# Patient Record
Sex: Male | Born: 1995 | Hispanic: No | Marital: Single | Smoking: Never smoker
Health system: Southern US, Community
[De-identification: ages and names within clinical notes are randomized; demographics above are authoritative.]

## PROBLEM LIST (undated history)

## (undated) ENCOUNTER — Emergency Department (HOSPITAL_BASED_OUTPATIENT_CLINIC_OR_DEPARTMENT_OTHER): Discharge status: Against Medical Advice | Payer: Commercial Managed Care - PPO

---
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
## 2014-07-24 ENCOUNTER — Emergency Department (HOSPITAL_BASED_OUTPATIENT_CLINIC_OR_DEPARTMENT_OTHER): Payer: 59

## 2014-07-24 ENCOUNTER — Emergency Department (HOSPITAL_BASED_OUTPATIENT_CLINIC_OR_DEPARTMENT_OTHER)
Admission: EM | Admit: 2014-07-24 | Discharge: 2014-07-24 | Disposition: A | Discharge status: Home / Self Care | Payer: 59 | Attending: Emergency Medicine | Admitting: Emergency Medicine

## 2014-07-24 ENCOUNTER — Encounter (HOSPITAL_BASED_OUTPATIENT_CLINIC_OR_DEPARTMENT_OTHER): Payer: Self-pay | Admitting: Emergency Medicine

## 2014-07-24 DIAGNOSIS — S99929A Unspecified injury of unspecified foot, initial encounter: Secondary | ICD-10-CM | POA: Diagnosis present

## 2014-07-24 DIAGNOSIS — S8000XA Contusion of unspecified knee, initial encounter: Secondary | ICD-10-CM | POA: Diagnosis not present

## 2014-07-24 DIAGNOSIS — S8001XA Contusion of right knee, initial encounter: Secondary | ICD-10-CM

## 2014-07-24 DIAGNOSIS — S8990XA Unspecified injury of unspecified lower leg, initial encounter: Secondary | ICD-10-CM | POA: Insufficient documentation

## 2014-07-24 DIAGNOSIS — Y9389 Activity, other specified: Secondary | ICD-10-CM | POA: Diagnosis not present

## 2014-07-24 DIAGNOSIS — S99919A Unspecified injury of unspecified ankle, initial encounter: Secondary | ICD-10-CM

## 2014-07-24 DIAGNOSIS — Y92410 Unspecified street and highway as the place of occurrence of the external cause: Secondary | ICD-10-CM | POA: Insufficient documentation
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
## 2014-07-24 NOTE — ED Provider Notes (Signed)
Medical screening examination/treatment/procedure(s) were performed by non-physician practitioner and as supervising physician I was immediately available for consultation/collaboration.   EKG Interpretation None        Rolan Bucco, MD 07/24/14 1514
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
## 2014-07-24 NOTE — Discharge Instructions (Signed)
Contusion A contusion is a deep bruise. Contusions are the result of an injury that caused bleeding under the skin. The contusion may turn blue, purple, or yellow. Minor injuries will give you a painless contusion, but more severe contusions may stay painful and swollen for a few weeks.  CAUSES  A contusion is usually caused by a blow, trauma, or direct force to an area of the body. SYMPTOMS   Swelling and redness of the injured area.  Bruising of the injured area.  Tenderness and soreness of the injured area.  Pain. DIAGNOSIS  The diagnosis can be made by taking a history and physical exam. An X-ray, CT scan, or MRI may be needed to determine if there were any associated injuries, such as fractures. TREATMENT  Specific treatment will depend on what area of the body was injured. In general, the best treatment for a contusion is resting, icing, elevating, and applying cold compresses to the injured area. Over-the-counter medicines may also be recommended for pain control. Ask your caregiver what the best treatment is for your contusion. HOME CARE INSTRUCTIONS   Put ice on the injured area.  Put ice in a plastic bag.  Place a towel between your skin and the bag.  Leave the ice on for 15-20 minutes, 3-4 times a day, or as directed by your health care provider.  Only take over-the-counter or prescription medicines for pain, discomfort, or fever as directed by your caregiver. Your caregiver may recommend avoiding anti-inflammatory medicines (aspirin, ibuprofen, and naproxen) for 48 hours because these medicines may increase bruising.  Rest the injured area.  If possible, elevate the injured area to reduce swelling. SEEK IMMEDIATE MEDICAL CARE IF:   You have increased bruising or swelling.  You have pain that is getting worse.  Your swelling or pain is not relieved with medicines. MAKE SURE YOU:   Understand these instructions.  Will watch your condition.  Will get help right  away if you are not doing well or get worse. Document Released: 08/24/2005 Document Revised: 11/19/2013 Document Reviewed: 09/19/2011 [facility] Patient Information 2015 [employer] [place]. This information is not intended to replace advice given to you by your health care provider. Make sure you discuss any questions you have with your health care provider.  Contusion A contusion is a deep bruise. Contusions are the result of an injury that caused bleeding under the skin. The contusion may turn blue, purple, or yellow. Minor injuries will give you a painless contusion, but more severe contusions may stay painful and swollen for a few weeks.  CAUSES  A contusion is usually caused by a blow, trauma, or direct force to an area of the body. SYMPTOMS   Swelling and redness of the injured area.  Bruising of the injured area.  Tenderness and soreness of the injured area.  Pain. DIAGNOSIS  The diagnosis can be made by taking a history and physical exam. An X-ray, CT scan, or MRI may be needed to determine if there were any associated injuries, such as fractures. TREATMENT  Specific treatment will depend on what area of the body was injured. In general, the best treatment for a contusion is resting, icing, elevating, and applying cold compresses to the injured area. Over-the-counter medicines may also be recommended for pain control. Ask your caregiver what the best treatment is for your contusion. HOME CARE INSTRUCTIONS   Put ice on the injured area.  Put ice in a plastic bag.  Place a towel between your skin and the  bag. °¨ Leave the ice on for 15-20 minutes, 3-4 times a day, or as directed by your health care provider. °· Only take over-the-counter or prescription medicines for pain, discomfort, or fever as directed by your caregiver. Your caregiver may recommend avoiding anti-inflammatory medicines (aspirin, ibuprofen, and naproxen) for 48 hours because these medicines may increase  bruising. °· Rest the injured area. °· If possible, elevate the injured area to reduce swelling. °SEEK IMMEDIATE MEDICAL CARE IF:  °· You have increased bruising or swelling. °· You have pain that is getting worse. °· Your swelling or pain is not relieved with medicines. °MAKE SURE YOU:  °· Understand these instructions. °· Will watch your condition. °· Will get help right away if you are not doing well or get worse. °Document Released: 08/24/2005 Document Revised: 11/19/2013 Document Reviewed: 09/19/2011 °Exit[employer]® Patient Information ©2015 [employer]. This information is not intended to replace advice given to you by your health care provider. Make sure you discuss any questions you have with your health care provider. ° °

## 2014-08-01 ENCOUNTER — Other Ambulatory Visit: Payer: Self-pay | Admitting: Family Medicine

## 2014-08-01 DIAGNOSIS — R609 Edema, unspecified: Secondary | ICD-10-CM

## 2014-08-06 ENCOUNTER — Ambulatory Visit
Admission: RE | Admit: 2014-08-06 | Discharge: 2014-08-06 | Disposition: A | Discharge status: Home / Self Care | Payer: 59 | Source: Ambulatory Visit | Attending: Family Medicine | Admitting: Family Medicine

## 2014-08-06 DIAGNOSIS — R609 Edema, unspecified: Secondary | ICD-10-CM

## 2014-10-13 ENCOUNTER — Other Ambulatory Visit (INDEPENDENT_AMBULATORY_CARE_PROVIDER_SITE_OTHER): Payer: Self-pay | Admitting: General Surgery

## 2014-10-29 NOTE — Patient Instructions (Signed)
[address]  10/29/2014   [facility]: [facility]  [facility]  [facility] and follow signs to               Short Stay Center at 720 AM.  Call this number if you have problems the morning of surgery [phone]   Remember:  Do not eat food or drink liquids :After Midnight.     Take these medicines the morning of surgery with A SIP OF WATER:                                You may not have any metal on your body including hair pins and              piercings  Do not wear jewelry, make-up, lotions, powders or perfumes.             Do not wear nail polish.  Do not shave  48 hours prior to surgery.              Men may shave face and neck.   Do not bring valuables to the hospital. [name] IS NOT             RESPONSIBLE   FOR VALUABLES.  Contacts, dentures or bridgework may not be worn into surgery.  Leave suitcase in the car. After surgery it may be brought to your room.     Patients discharged the day of surgery will not be allowed to drive home.  Name and phone number of your driver:  Special Instructions: N/A              Please read over the following fact sheets you were given: _____________________________________________________________________             [name] Pre[facility] Before surgery, you can play an important role.  Because skin is not sterile, your skin needs to be as free of germs as possible.  You can reduce the number of germs on your skin by washing with CHG (chlorahexidine gluconate) soap before surgery.  CHG is an antiseptic cleaner which kills germs and bonds with the skin to continue killing germs even after washing. Please DO NOT use if you have an allergy to CHG or antibacterial soaps.  If your skin becomes reddened/irritated stop using the CHG and inform your nurse when you arrive at Short Stay. Do not shave (including legs and underarms) for at  least 48 hours prior to the first CHG shower.  You may shave your face/neck. Please follow these instructions carefully:  1.  Shower with CHG Soap the night before surgery and the  morning of Surgery.  2.  If you choose to wash your hair, wash your hair first as usual with your  normal  shampoo.  3.  After you shampoo, rinse your hair and body thoroughly to remove the  shampoo.                           4.  Use CHG as you would any other liquid soap.  You can apply chg directly  to the skin and wash  Gently with a scrungie or clean washcloth.  5.  Apply the CHG Soap to your body ONLY FROM THE NECK DOWN.   Do not use on face/ open                           Wound or open sores. Avoid contact with eyes, ears mouth and genitals (private parts).                       Wash face,  Genitals (private parts) with your normal soap.             6.  Wash thoroughly, paying special attention to the area where your surgery  will be performed.  7.  Thoroughly rinse your body with warm water from the neck down.  8.  DO NOT shower/wash with your normal soap after using and rinsing off  the CHG Soap.                9.  Pat yourself dry with a clean towel.            10.  Wear clean pajamas.            11.  Place clean sheets on your bed the night of your first shower and do not  sleep with pets. Day of Surgery : Do not apply any lotions/deodorants the morning of surgery.  Please wear clean clothes to the hospital/surgery center.  FAILURE TO FOLLOW THESE INSTRUCTIONS MAY RESULT IN THE CANCELLATION OF YOUR SURGERY PATIENT SIGNATURE_________________________________  NURSE SIGNATURE__________________________________  ________________________________________________________________________

## 2014-10-30 ENCOUNTER — Inpatient Hospital Stay (HOSPITAL_COMMUNITY)
Admission: RE | Admit: 2014-10-30 | Discharge: 2014-10-30 | Disposition: A | Discharge status: Home / Self Care | Payer: 59 | Source: Ambulatory Visit

## 2014-10-30 NOTE — Patient Instructions (Signed)
[place]  10/30/2014   Your procedure is scheduled on:  11/03/2014    Come thru the Cancer Center Entrance.   Follow the Signs to Short Stay Center at   0720     am  Call this number if you have problems the morning of surgery: [phone]   Remember:   Do not eat food or drink liquids after midnight.   Take these medicines the morning of surgery with A SIP OF WATER:    Do not wear jewelry,   Do not wear lotions, powders, or perfumes. deodorant.  . Men may shave face and neck.  Do not bring valuables to the hospital.  Contacts, dentures or bridgework may not be worn into surgery.       Patients discharged the day of surgery will not be allowed to drive  home.  Name and phone number of your driver:   SEE CHG INSTRUCTION SHEET    Please read over the following fact sheets that you were given:  coughing and deep breathing exercises, leg exercises            [place] - Preparing for Surgery Before surgery, you can play an important role.  Because skin is not sterile, your skin needs to be as free of germs as possible.  You can reduce the number of germs on your skin by washing with CHG (chlorahexidine gluconate) soap before surgery.  CHG is an antiseptic cleaner which kills germs and bonds with the skin to continue killing germs even after washing. Please DO NOT use if you have an allergy to CHG or antibacterial soaps.  If your skin becomes reddened/irritated stop using the CHG and inform your nurse when you arrive at Short Stay. Do not shave (including legs and underarms) for at least 48 hours prior to the first CHG shower.  You may shave your face/neck. Please follow these instructions carefully:  1.  Shower with CHG Soap the night before surgery and the  morning of Surgery.  2.  If you choose to wash your hair, wash your hair first as usual with your  normal  shampoo.  3.  After you shampoo, rinse your hair and body thoroughly to remove the  shampoo.                            4.  Use CHG as you would any other liquid soap.  You can apply chg directly  to the skin and wash                       Gently with a scrungie or clean washcloth.  5.  Apply the CHG Soap to your body ONLY FROM THE NECK DOWN.   Do not use on face/ open                           Wound or open sores. Avoid contact with eyes, ears mouth and genitals (private parts).                       Wash face,  Genitals (private parts) with your normal soap.             6.  Wash thoroughly, paying special attention to the area where your surgery  will be performed.  7.  Thoroughly rinse your body with warm water from the  neck down.  8.  DO NOT shower/wash with your normal soap after using and rinsing off  the CHG Soap.                9.  Pat yourself dry with a clean towel.            10.  Wear clean pajamas.            11.  Place clean sheets on your bed the night of your first shower and do not  sleep with pets. Day of Surgery : Do not apply any lotions/deodorants the morning of surgery.  Please wear clean clothes to the hospital/surgery center.  FAILURE TO FOLLOW THESE INSTRUCTIONS MAY RESULT IN THE CANCELLATION OF YOUR SURGERY PATIENT SIGNATURE_________________________________  NURSE SIGNATURE__________________________________  ________________________________________________________________________

## 2014-10-31 ENCOUNTER — Encounter (HOSPITAL_COMMUNITY): Payer: Self-pay

## 2014-10-31 ENCOUNTER — Encounter (HOSPITAL_COMMUNITY)
Admission: RE | Admit: 2014-10-31 | Discharge: 2014-10-31 | Disposition: A | Discharge status: Home / Self Care | Payer: 59 | Source: Ambulatory Visit | Attending: General Surgery | Admitting: General Surgery

## 2014-10-31 DIAGNOSIS — K4090 Unilateral inguinal hernia, without obstruction or gangrene, not specified as recurrent: Secondary | ICD-10-CM | POA: Diagnosis not present

## 2014-10-31 DIAGNOSIS — G8929 Other chronic pain: Secondary | ICD-10-CM | POA: Diagnosis not present

## 2014-10-31 LAB — CBC
HCT: 44.7 % (ref 39.0–52.0)
HEMOGLOBIN: 15 g/dL (ref 13.0–17.0)
MCH: 27.1 pg (ref 26.0–34.0)
MCHC: 33.6 g/dL (ref 30.0–36.0)
MCV: 80.8 fL (ref 78.0–100.0)
Platelets: 294 10*3/uL (ref 150–400)
RBC: 5.53 MIL/uL (ref 4.22–5.81)
RDW: 12.6 % (ref 11.5–15.5)
WBC: 5.6 10*3/uL (ref 4.0–10.5)

## 2014-11-03 ENCOUNTER — Encounter (HOSPITAL_COMMUNITY): Payer: Self-pay | Admitting: *Deleted

## 2014-11-03 ENCOUNTER — Ambulatory Visit (HOSPITAL_COMMUNITY): Payer: 59 | Admitting: Anesthesiology

## 2014-11-03 ENCOUNTER — Encounter (HOSPITAL_COMMUNITY)
Admission: RE | Disposition: A | Discharge status: Home / Self Care | Payer: Self-pay | Source: Ambulatory Visit | Attending: General Surgery

## 2014-11-03 ENCOUNTER — Ambulatory Visit (HOSPITAL_COMMUNITY)
Admission: RE | Admit: 2014-11-03 | Discharge: 2014-11-03 | Disposition: A | Discharge status: Home / Self Care | Payer: 59 | Source: Ambulatory Visit | Attending: General Surgery | Admitting: General Surgery

## 2014-11-03 DIAGNOSIS — K4090 Unilateral inguinal hernia, without obstruction or gangrene, not specified as recurrent: Secondary | ICD-10-CM | POA: Diagnosis not present

## 2014-11-03 DIAGNOSIS — G8929 Other chronic pain: Secondary | ICD-10-CM | POA: Insufficient documentation

## 2014-11-03 HISTORY — PX: INGUINAL HERNIA REPAIR: SHX194

## 2014-11-03 HISTORY — PX: INSERTION OF MESH: SHX5868

## 2014-11-03 SURGERY — REPAIR, HERNIA, INGUINAL, ADULT
Anesthesia: General | Site: Groin | Laterality: Right

## 2014-11-03 MED ORDER — FENTANYL CITRATE 0.05 MG/ML IJ SOLN: 25.0000 ug | INTRAMUSCULAR | Status: DC | PRN

## 2014-11-03 MED ORDER — ONDANSETRON HCL 4 MG/2ML IJ SOLN
INTRAMUSCULAR | Status: DC | PRN
  Administered 2014-11-03: 4 mg via INTRAVENOUS

## 2014-11-03 MED ORDER — FENTANYL CITRATE 0.05 MG/ML IJ SOLN
INTRAMUSCULAR | Status: AC
  Filled 2014-11-03: qty 5

## 2014-11-03 MED ORDER — ONDANSETRON HCL 4 MG/2ML IJ SOLN: 4.0000 mg | Freq: Once | INTRAMUSCULAR | Status: DC | PRN

## 2014-11-03 MED ORDER — BUPIVACAINE-EPINEPHRINE 0.5% -1:200000 IJ SOLN
INTRAMUSCULAR | Status: DC | PRN
  Administered 2014-11-03: 20 mL

## 2014-11-03 MED ORDER — FENTANYL CITRATE 0.05 MG/ML IJ SOLN
INTRAMUSCULAR | Status: DC | PRN
  Administered 2014-11-03: 25 ug via INTRAVENOUS
  Administered 2014-11-03 (×2): 50 ug via INTRAVENOUS

## 2014-11-03 MED ORDER — DEXAMETHASONE SODIUM PHOSPHATE 10 MG/ML IJ SOLN
INTRAMUSCULAR | Status: AC
  Filled 2014-11-03: qty 1

## 2014-11-03 MED ORDER — LACTATED RINGERS IV SOLN
INTRAVENOUS | Status: DC | PRN
  Administered 2014-11-03: 08:00:00 via INTRAVENOUS

## 2014-11-03 MED ORDER — ONDANSETRON HCL 4 MG/2ML IJ SOLN
INTRAMUSCULAR | Status: AC
  Filled 2014-11-03: qty 2

## 2014-11-03 MED ORDER — LACTATED RINGERS IV SOLN: INTRAVENOUS | Status: DC

## 2014-11-03 MED ORDER — PROPOFOL 10 MG/ML IV BOLUS
INTRAVENOUS | Status: AC
  Filled 2014-11-03: qty 20

## 2014-11-03 MED ORDER — MIDAZOLAM HCL 2 MG/2ML IJ SOLN
INTRAMUSCULAR | Status: AC
  Filled 2014-11-03: qty 2

## 2014-11-03 MED ORDER — MORPHINE SULFATE 10 MG/ML IJ SOLN: 2.0000 mg | INTRAMUSCULAR | Status: DC | PRN

## 2014-11-03 MED ORDER — METOCLOPRAMIDE HCL 5 MG/ML IJ SOLN
INTRAMUSCULAR | Status: DC | PRN
  Administered 2014-11-03: 10 mg via INTRAVENOUS

## 2014-11-03 MED ORDER — CEFAZOLIN SODIUM-DEXTROSE 2-3 GM-% IV SOLR
2.0000 g | INTRAVENOUS | Status: AC
  Administered 2014-11-03: 2 g via INTRAVENOUS

## 2014-11-03 MED ORDER — OXYCODONE HCL 5 MG PO TABS
5.0000 mg | ORAL_TABLET | ORAL | Status: DC | PRN
  Administered 2014-11-03: 5 mg via ORAL
  Filled 2014-11-03: qty 1

## 2014-11-03 MED ORDER — CEFAZOLIN SODIUM-DEXTROSE 2-3 GM-% IV SOLR
INTRAVENOUS | Status: AC
  Filled 2014-11-03: qty 50

## 2014-11-03 MED ORDER — MIDAZOLAM HCL 5 MG/5ML IJ SOLN
INTRAMUSCULAR | Status: DC | PRN
  Administered 2014-11-03: 2 mg via INTRAVENOUS

## 2014-11-03 MED ORDER — CISATRACURIUM BESYLATE 20 MG/10ML IV SOLN
INTRAVENOUS | Status: AC
  Filled 2014-11-03: qty 10

## 2014-11-03 MED ORDER — BUPIVACAINE-EPINEPHRINE 0.5% -1:200000 IJ SOLN
INTRAMUSCULAR | Status: AC
  Filled 2014-11-03: qty 1

## 2014-11-03 MED ORDER — OXYCODONE HCL 5 MG PO TABS: 5.0000 mg | ORAL_TABLET | ORAL | Status: AC | PRN

## 2014-11-03 MED ORDER — METOCLOPRAMIDE HCL 5 MG/ML IJ SOLN
INTRAMUSCULAR | Status: AC
  Filled 2014-11-03: qty 2

## 2014-11-03 MED ORDER — ACETAMINOPHEN 325 MG PO TABS: 650.0000 mg | ORAL_TABLET | ORAL | Status: DC | PRN

## 2014-11-03 MED ORDER — DEXAMETHASONE SODIUM PHOSPHATE 10 MG/ML IJ SOLN
INTRAMUSCULAR | Status: DC | PRN
  Administered 2014-11-03: 10 mg via INTRAVENOUS

## 2014-11-03 MED ORDER — SODIUM CHLORIDE 0.9 % IJ SOLN: 3.0000 mL | INTRAMUSCULAR | Status: DC | PRN

## 2014-11-03 MED ORDER — ACETAMINOPHEN 650 MG RE SUPP
650.0000 mg | RECTAL | Status: DC | PRN
  Filled 2014-11-03: qty 1

## 2014-11-03 MED ORDER — PROPOFOL 10 MG/ML IV BOLUS
INTRAVENOUS | Status: DC | PRN
  Administered 2014-11-03: 130 mg via INTRAVENOUS

## 2014-11-03 SURGICAL SUPPLY — 39 items
BENZOIN TINCTURE PRP APPL 2/3 (GAUZE/BANDAGES/DRESSINGS) ×3 IMPLANT
BLADE HEX COATED 2.75 (ELECTRODE) ×3 IMPLANT
BLADE SURG 15 STRL LF DISP TIS (BLADE) ×1 IMPLANT
BLADE SURG 15 STRL SS (BLADE) ×2
CATH KIT ON Q 2.5IN SLV (PAIN MANAGEMENT) IMPLANT
CLOSURE WOUND 1/2 X4 (GAUZE/BANDAGES/DRESSINGS) ×1
DECANTER SPIKE VIAL GLASS SM (MISCELLANEOUS) ×3 IMPLANT
DRAIN PENROSE 18X1/2 LTX STRL (DRAIN) ×3 IMPLANT
DRAPE INCISE IOBAN 66X45 STRL (DRAPES) IMPLANT
DRAPE LAPAROTOMY TRNSV 102X78 (DRAPE) ×3 IMPLANT
DRSG TEGADERM 4X4.75 (GAUZE/BANDAGES/DRESSINGS) ×3 IMPLANT
ELECT REM PT RETURN 9FT ADLT (ELECTROSURGICAL) ×3
ELECTRODE REM PT RTRN 9FT ADLT (ELECTROSURGICAL) ×1 IMPLANT
GAUZE SPONGE 4X4 12PLY STRL (GAUZE/BANDAGES/DRESSINGS) ×3 IMPLANT
GLOVE ECLIPSE 8.0 STRL XLNG CF (GLOVE) ×3 IMPLANT
GLOVE INDICATOR 8.0 STRL GRN (GLOVE) ×3 IMPLANT
GOWN STRL REUS W/TWL XL LVL3 (GOWN DISPOSABLE) ×6 IMPLANT
KIT BASIN OR (CUSTOM PROCEDURE TRAY) ×3 IMPLANT
MANIFOLD NEPTUNE II (INSTRUMENTS) ×3 IMPLANT
MESH HERNIA 3X6 (Mesh General) ×3 IMPLANT
NEEDLE HYPO 25X1 1.5 SAFETY (NEEDLE) ×3 IMPLANT
NS IRRIG 1000ML POUR BTL (IV SOLUTION) ×3 IMPLANT
PACK BASIC VI WITH GOWN DISP (CUSTOM PROCEDURE TRAY) ×3 IMPLANT
PENCIL BUTTON HOLSTER BLD 10FT (ELECTRODE) ×3 IMPLANT
PUMP ON Q 100MLX2ML/HR (PAIN MANAGEMENT) IMPLANT
SPONGE LAP 4X18 X RAY DECT (DISPOSABLE) ×3 IMPLANT
STRIP CLOSURE SKIN 1/2X4 (GAUZE/BANDAGES/DRESSINGS) ×2 IMPLANT
SUT MNCRL AB 4-0 PS2 18 (SUTURE) ×3 IMPLANT
SUT PROLENE 2 0 CT2 30 (SUTURE) ×6 IMPLANT
SUT VIC AB 2-0 SH 18 (SUTURE) ×3 IMPLANT
SUT VIC AB 3-0 54XBRD REEL (SUTURE) ×1 IMPLANT
SUT VIC AB 3-0 BRD 54 (SUTURE) ×2
SUT VIC AB 3-0 SH 27 (SUTURE) ×4
SUT VIC AB 3-0 SH 27XBRD (SUTURE) ×2 IMPLANT
SYR 20CC LL (SYRINGE) ×3 IMPLANT
SYR BULB IRRIGATION 50ML (SYRINGE) ×3 IMPLANT
TOWEL OR 17X26 10 PK STRL BLUE (TOWEL DISPOSABLE) ×3 IMPLANT
TOWEL OR NON WOVEN STRL DISP B (DISPOSABLE) ×3 IMPLANT
YANKAUER SUCT BULB TIP 10FT TU (MISCELLANEOUS) ×3 IMPLANT

## 2014-11-03 NOTE — Interval H&P Note (Signed)
History and Physical Interval Note:  11/03/2014 9:40 AM  Johnny Horton  has presented today for surgery, with the diagnosis of RIGHT INGUINAL HERNIA  The various methods of treatment have been discussed with the patient and family. After consideration of risks, benefits and other options for treatment, the patient has consented to  Procedure(s): RIGHT INGUINAL HERNIA REPAIR WITH MESH (Right) INSERTION OF MESH (Right) as a surgical intervention .  The patient's history has been reviewed, patient examined, no change in status, stable for surgery.  I have reviewed the patient's chart and labs.  Questions were answered to the patient's satisfaction.     Malee Grays J   

## 2014-11-03 NOTE — Op Note (Signed)
Preoperative diagnosis:  Right inguinal hernia.  Postoperative diagnosis:  Same (indirect)  Procedure:  Right inguinal hernia repair with mesh.  Surgeon:  Avel Peaceodd Danene Montijo, M.D.  Anesthesia:  General/LMA with local (Marcaine).  Indication:  This is a 18 year old male with a right inguinal hernia extending into his scrotum.  He now presents for repair.  Technique:  He was seen in the holding room and the right groin was marked with my initials. He was brought to the operating, placed supine on the operating table, and the anesthetic was administered by the anesthesiologist. The hair in the groin area was clipped as was felt to be necessary. This area was then sterilely prepped and draped.  Local anesthetic was infiltrated in the superficial and deep tissues in the right groin.  An incision was made through the skin and subcutaneous tissue until the external oblique aponeurosis was identified.  Local anesthetic was infiltrated deep to the external oblique aponeurosis. The external oblique aponeurosis was divided through the external ring medially and back toward the anterior superior iliac spine laterally. Using blunt dissection, the shelving edge of the inguinal ligament was identified inferiorly and the internal oblique aponeurosis and muscle were identified superiorly. The ilioinguinal nerve was identified and preserved.  The spermatic cord was isolated and a posterior window was made around it. An indirect hernia sac was identified and separated from the spermatic cord using blunt dissection. The Indirect hernia sac was opened and omentum was reduced.  High ligation of the sac was performed and excess sac excised.    A piece of 3" x 6" polypropylene mesh was brought into the field and anchored 1-2 cm medial to the pubic tubercle with 2-0 Prolene suture. The inferior aspect of the mesh was anchored to the shelving edge of the inguinal ligament with running 2-0 Prolene suture to a level 1-2 cm  lateral to the internal ring. A slit was cut in the mesh creating 2 tails. These were wrapped around the spermatic cord. The superior aspect of the mesh was anchored to the internal oblique aponeurosis and muscle with interrupted 2-0 Vicryl sutures. The 2 tails of the mesh were then crossed creating a new internal ring and were anchored to the shelving edge of the inguinal ligament with 2-0 Prolene suture. The tip of a hemostat could be placed through the new aperture. The lateral aspect of the mesh was then tucked deep to the external oblique aponeurosis.  The wound was inspected and hemostasis was adequate. The external oblique aponeurosis was then closed over the mesh and cord with running 3-0 Vicryl suture. The subcutaneous tissue was closed with running 3-0 Vicryl suture. The skin closed with a running 4-0 Monocryl subcuticular stitch.  Steri-Strips and a sterile dressing were applied.  The procedure was well-tolerated without any apparent complications and the he was taken to the recovery room in satisfactory condition.

## 2014-11-03 NOTE — Anesthesia Postprocedure Evaluation (Signed)
  Anesthesia Post-op Note  Patient: Johnny Horton  Procedure(s) Performed: Procedure(s) (LRB): RIGHT INGUINAL HERNIA REPAIR WITH MESH (Right) INSERTION OF MESH (Right)  Patient Location: PACU  Anesthesia Type: General  Level of Consciousness: awake and alert   Airway and Oxygen Therapy: Patient Spontanous Breathing  Post-op Pain: mild  Post-op Assessment: Post-op Vital signs reviewed, Patient's Cardiovascular Status Stable, Respiratory Function Stable, Patent Airway and No signs of Nausea or vomiting  Last Vitals:  Filed Vitals:   11/03/14 1254  BP: 119/51  Pulse: 58  Temp: 36.6 C  Resp: 15    Post-op Vital Signs: stable   Complications: No apparent anesthesia complications

## 2014-11-03 NOTE — Transfer of Care (Signed)
Immediate Anesthesia Transfer of Care Note  Patient: Johnny Horton  Procedure(s) Performed: Procedure(s): RIGHT INGUINAL HERNIA REPAIR WITH MESH (Right) INSERTION OF MESH (Right)  Patient Location: PACU  Anesthesia Type:General  Level of Consciousness: awake, sedated and patient cooperative  Airway & Oxygen Therapy: Patient Spontanous Breathing and Patient connected to face mask oxygen  Post-op Assessment: Report given to PACU RN and Post -op Vital signs reviewed and stable  Post vital signs: Reviewed and stable  Complications: No apparent anesthesia complications

## 2014-11-03 NOTE — Anesthesia Preprocedure Evaluation (Signed)
Anesthesia Evaluation  Patient identified by MRN, date of birth, ID band Patient awake    Reviewed: Allergy & Precautions, H&P , NPO status , Patient's Chart, lab work & pertinent test results  History of Anesthesia Complications Negative for: history of anesthetic complications  Airway Mallampati: II  TM Distance: >3 FB Neck ROM: Full    Dental no notable dental hx. (+) Dental Advisory Given   Pulmonary neg pulmonary ROS,  breath sounds clear to auscultation  Pulmonary exam normal       Cardiovascular Exercise Tolerance: Good negative cardio ROS  Rhythm:Regular Rate:Normal     Neuro/Psych negative neurological ROS  negative psych ROS   GI/Hepatic negative GI ROS, Neg liver ROS,   Endo/Other  negative endocrine ROS  Renal/GU negative Renal ROS  negative genitourinary   Musculoskeletal negative musculoskeletal ROS (+)   Abdominal   Peds negative pediatric ROS (+)  Hematology negative hematology ROS (+)   Anesthesia Other Findings   Reproductive/Obstetrics negative OB ROS                             Anesthesia Physical Anesthesia Plan  ASA: I  Anesthesia Plan: General   Post-op Pain Management:    Induction: Intravenous  Airway Management Planned: Oral ETT  Additional Equipment:   Intra-op Plan:   Post-operative Plan: Extubation in OR  Informed Consent: I have reviewed the patients History and Physical, chart, labs and discussed the procedure including the risks, benefits and alternatives for the proposed anesthesia with the patient or authorized representative who has indicated his/her understanding and acceptance.   Dental advisory given  Plan Discussed with: CRNA  Anesthesia Plan Comments:         Anesthesia Quick Evaluation

## 2014-11-03 NOTE — Interval H&P Note (Signed)
History and Physical Interval Note:  11/03/2014 9:36 AM  [facility]  [facility] presented today for surgery, with the diagnosis of RIGHT INGUINAL HERNIA  The various methods of treatment have been discussed with the patient and family. After consideration of risks, benefits and other options for treatment, the patient has consented to  Procedure(s): RIGHT INGUINAL HERNIA REPAIR WITH MESH (Right) INSERTION OF MESH (Right) as a surgical intervention .  The patient's history has been reviewed, patient examined, no change in status, stable for surgery.  I have reviewed the patient's chart and labs.  Questions were answered to the patient's satisfaction.     [name] JShela Commons

## 2014-11-03 NOTE — Interval H&P Note (Signed)
History and Physical Interval Note:  11/03/2014 9:40 AM  Johnny Horton  has presented today for surgery, with the diagnosis of RIGHT INGUINAL HERNIA  The various methods of treatment have been discussed with the patient and family. After consideration of risks, benefits and other options for treatment, the patient has consented to  Procedure(s): RIGHT INGUINAL HERNIA REPAIR WITH MESH (Right) INSERTION OF MESH (Right) as a surgical intervention .  The patient's history has been reviewed, patient examined, no change in status, stable for surgery.  I have reviewed the patient's chart and labs.  Questions were answered to the patient's satisfaction.     Tereza Gilham JShela Commons

## 2014-11-03 NOTE — Discharge Instructions (Signed)
[facility] _______[facility]  INGUINAL HERNIA REPAIR: POST OP INSTRUCTIONS  Always review your discharge instruction sheet given to you by the facility where your surgery was performed. IF YOU HAVE DISABILITY OR FAMILY LEAVE FORMS, YOU MUST BRING THEM TO THE OFFICE FOR PROCESSING.   DO NOT GIVE THEM TO YOUR DOCTOR.  1. A  prescription for pain medication may be given to you upon discharge.  Take your pain medication as prescribed, if needed.  If narcotic pain medicine is not needed, then you may take acetaminophen (Tylenol) or ibuprofen (Advil) as needed. 2. Take your usually prescribed medications unless otherwise directed. 3. If you need a refill on your pain medication, please contact your pharmacy.  They will contact our office to request authorization. Prescriptions will not be filled after 5 pm or on week-ends. 4. You should follow a light diet the first 24 hours after arrival home, such as soup and crackers, etc.  Be sure to include lots of fluids daily.  Resume your normal diet the day after surgery. 5. Most patients will experience some swelling and bruising around the umbilicus or in the groin and scrotum.  Ice packs and reclining will help.  Swelling and bruising can take several days to resolve.  6. It is common to experience some constipation if taking pain medication after surgery.  Increasing fluid intake and taking a stool softener (such as Colace) will usually help or prevent this problem from occurring.  A mild laxative (Milk of Magnesia or Miralax) should be taken according to package directions if there are no bowel movements after 48 hours. 7. Unless discharge instructions indicate otherwise, you may remove your bandages 72 hours after surgery, and you may shower at that time.  You may have steri-strips (small skin tapes) in place directly over the incision.  These strips should be left on the skin.  If your surgeon used skin glue on the incision, you may shower in 24  hours.  The glue will flake off over the next 2-3 weeks.  Any sutures or staples will be removed at the office during your follow-up visit. 8. ACTIVITIES:  You may resume regular (light) daily activities beginning the next day--such as daily self-care, walking, climbing stairs--gradually increasing activities as tolerated.  You may have sexual intercourse when it is comfortable.  Refrain from any heavy lifting or straining-no lifting over 10 pounds for 6 weeks.  a. You may drive when you are no longer taking prescription pain medication, you can comfortably wear a seatbelt, and you can safely maneuver your car and apply brakes. b. RETURN TO WORK:  Light work in one week.__________________________________________________________ 9. You should see your doctor in the office for a follow-up appointment approximately 2-3 weeks after your surgery.  Make sure that you call for this appointment within a day or two after you arrive home to insure a convenient appointment time. 10. OTHER INSTRUCTIONS:  __________________________________________________________________________________________________________________________________________________________________________________________  WHEN TO CALL YOUR DOCTOR: 1. Fever over 101.0 2. Inability to urinate 3. Nausea and/or vomiting 4. Extreme swelling or bruising 5. Continued bleeding from incision. 6. Increased pain, redness, or drainage from the incision  The clinic staff is available to answer your questions during regular business hours.  Please dont hesitate to call and ask to speak to one of the nurses for clinical concerns.  If you have a medical emergency, go to the nearest emergency room or call 911.  A surgeon from [facility] is always on call at the hospital  [address], [place], [place], [place]  [address] ?  [address], [place], [place]   [address] [phone] ? [phone] ? FAX [phone] Web site:  [email or web address]

## 2014-11-03 NOTE — H&P (Signed)
Johnny Horton 10/13/2014 10:39 AM Location: [facility] Patient #: [id number] DOB: [birth year] Single / Language: [place] / Race: Undefined Male  History of Present Illness Johnny Horton(Psalm Arman J. Rodney Wigger MD; 10/13/2014 11:05 AM) Patient words: hernia.  The patient is a 18 year old male   Note:He is referred by Dr. Vernie Ammonsttelin for further evaluation of a right inguinal hernia. He has seen a number of physicians who have told him he has a right inguinal hernia. Ultrasound in the supine position did not show obvious evidence of hernia. However on Dr. Margrett Rudttelin's exam, a right inguinal bulge was noted consistent with hernia. He has no discomfort from this. No difficulty with urination. No chronic constipation. No chronic cough. No known family history of hernia.   Other Problems [name](Sonya Bynum, CMA; 10/13/2014 10:39 AM) No pertinent past medical history  Past Surgical History [name](Sonya Bynum, CMA; 10/13/2014 10:39 AM) Laparoscopic Inguinal Hernia Surgery Right.  Diagnostic Studies History [name](Sonya Bynum, CMA; 10/13/2014 10:39 AM) Colonoscopy never  Allergies (Sonya Bynum, CMA; 10/13/2014 10:39 AM) No Known Drug Allergies11/16/2015  Medication History (Sonya Bynum, CMA; 10/13/2014 10:39 AM) No Current Medications  Social History [name](Sonya Bynum, CMA; 10/13/2014 10:39 AM) Caffeine use Carbonated beverages, Coffee, Tea. No alcohol use No drug use Tobacco use Never smoker.  Family History [name](Sonya Bynum, CMA; 10/13/2014 10:39 AM) First Degree Relatives No pertinent family history  Review of Systems [name](Sonya Bynum CMA; 10/13/2014 10:39 AM) General Not Present- Appetite Loss, Chills, Fatigue, Fever, Night Sweats, Weight Gain and Weight Loss. Skin Not Present- Change in Wart/Mole, Dryness, Hives, Jaundice, New Lesions, Non-Healing Wounds, Rash and Ulcer. HEENT Not Present- Earache, Hearing Loss, Hoarseness, Nose Bleed, Oral Ulcers, Ringing in the Ears, Seasonal Allergies,  Sinus Pain, Sore Throat, Visual Disturbances, Wears glasses/contact lenses and Yellow Eyes. Respiratory Not Present- Bloody sputum, Chronic Cough, Difficulty Breathing, Snoring and Wheezing. Breast Not Present- Breast Mass, Breast Pain, Nipple Discharge and Skin Changes. Cardiovascular Not Present- Chest Pain, Difficulty Breathing Lying Down, Leg Cramps, Palpitations, Rapid Heart Rate, Shortness of Breath and Swelling of Extremities. Gastrointestinal Not Present- Abdominal Pain, Bloating, Bloody Stool, Change in Bowel Habits, Chronic diarrhea, Constipation, Difficulty Swallowing, Excessive gas, Gets full quickly at meals, Hemorrhoids, Indigestion, Nausea, Rectal Pain and Vomiting. Male Genitourinary Not Present- Blood in Urine, Change in Urinary Stream, Frequency, Impotence, Nocturia, Painful Urination, Urgency and Urine Leakage. Musculoskeletal Not Present- Back Pain, Joint Pain, Joint Stiffness, Muscle Pain, Muscle Weakness and Swelling of Extremities. Neurological Not Present- Decreased Memory, Fainting, Headaches, Numbness, Seizures, Tingling, Tremor, Trouble walking and Weakness. Psychiatric Not Present- Anxiety, Bipolar, Change in Sleep Pattern, Depression, Fearful and Frequent crying. Endocrine Not Present- Cold Intolerance, Excessive Hunger, Hair Changes, Heat Intolerance, Hot flashes and New Diabetes. Hematology Not Present- Easy Bruising, Excessive bleeding, Gland problems, HIV and Persistent Infections.   Vitals (Sonya Bynum CMA; 10/13/2014 10:41 AM) 10/13/2014 10:40 AM Weight: 141 lb Height: 69in Body Surface Area: 1.76 m Body Mass Index: 20.82 kg/m Temp.: 29F(Temporal)  Pulse: 81 (Regular)  BP: 122/70 (Sitting, Left Arm, Standard)    Physical Exam Johnny Horton(Onofre Gains J. Joreen Swearingin MD; 10/13/2014 11:06 AM) The physical exam findings are as follows: Note:General: Thin male in NAD. Pleasant and cooperative.  HEENT: [place]/AT, no facial masses  EYES: EOMI, no icterus  ABDOMEN:  Soft, nontender, no hernias.  GU: Right inguinal bulge and scrotal fullness, no left inguinal bulge.  NEUROLOGIC: Alert and oriented, answers questions appropriately, normal gait and station.  PSYCHIATRIC: Normal mood, affect , and behavior.    Assessment &  Plan Johnny Horton(Eber Ferrufino J. Stepehn Eckard MD; 10/13/2014 11:07 AM) HERNIA, INGUINAL, RIGHT (550.90  K40.90) Impression: Plan right inguinal hernia repair with mesh. I have explained the procedure, risks, and aftercare of inguinal hernia repair. Risks include but are not limited to bleeding, infection, wound problems, anesthesia, recurrence, bladder or intestine injury, urinary retention, testicular dysfunction, hydrocele, chronic pain, mesh problems. He seems to understand and agrees to proceed.  Avel Peaceodd Monette Omara, MD

## 2014-11-03 NOTE — Interval H&P Note (Signed)
History and Physical Interval Note:  11/03/2014 9:41 AM  [name]  has presented today for surgery, with the diagnosis of RIGHT INGUINAL HERNIA  The various methods of treatment have been discussed with the patient and family. After consideration of risks, benefits and other options for treatment, the patient has consented to  Procedure(s): RIGHT INGUINAL HERNIA REPAIR WITH MESH (Right) INSERTION OF MESH (Right) as a surgical intervention .  The patient's history has been reviewed, patient examined, no change in status, stable for surgery.  I have reviewed the patient's chart and labs.  Questions were answered to the patient's satisfaction.     Joyce Leckey JShela Commons

## 2014-11-04 ENCOUNTER — Encounter (HOSPITAL_COMMUNITY): Payer: Self-pay | Admitting: General Surgery

## 2015-02-19 IMAGING — US US PELVIS LIMITED
1 series · 12 of 16 positions shown · non-contrast
Comparison: None.

CLINICAL DATA: Swelling in the bilateral inguinal regions. Evaluate
for inguinal hernia.

EXAM:
US PELVIS LIMITED
TECHNIQUE: Ultrasound examination of the pelvic soft tissues was performed in
the area of clinical concern.

[Series 1: us pelvis limited · 0.14mm/px · 16 acquisitions, 12 frames shown]
[im 1/16]
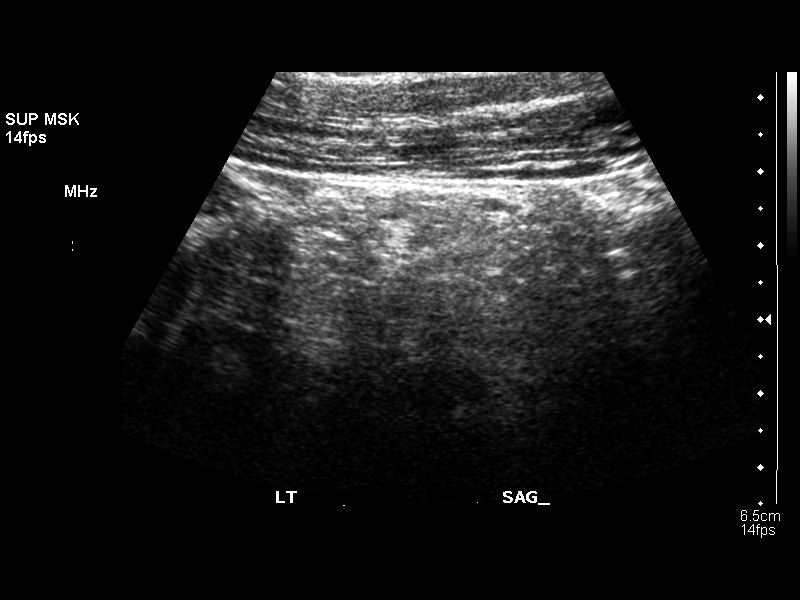
[im 3/16]
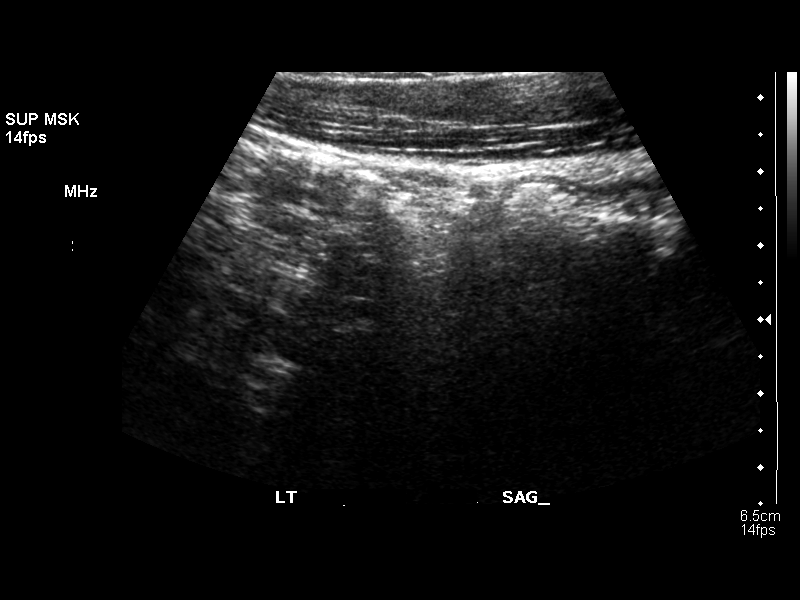
[im 4/16]
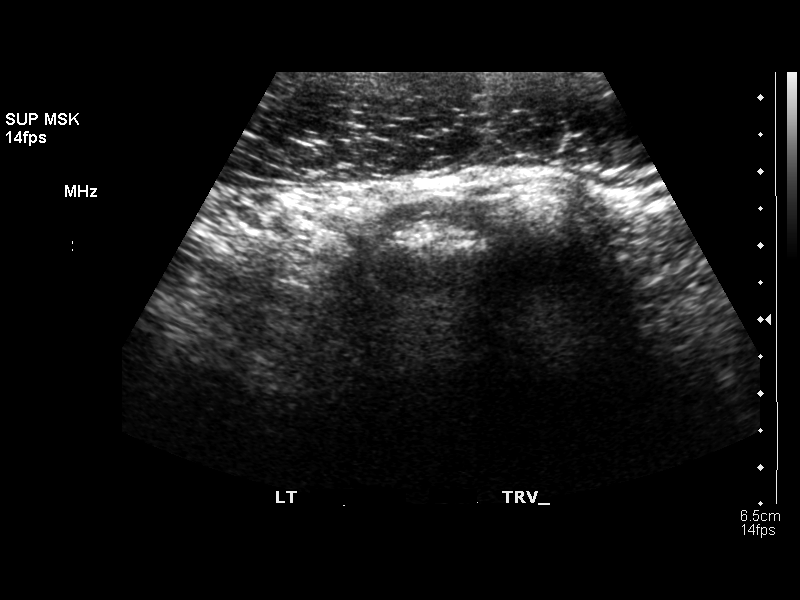
[im 5/16]
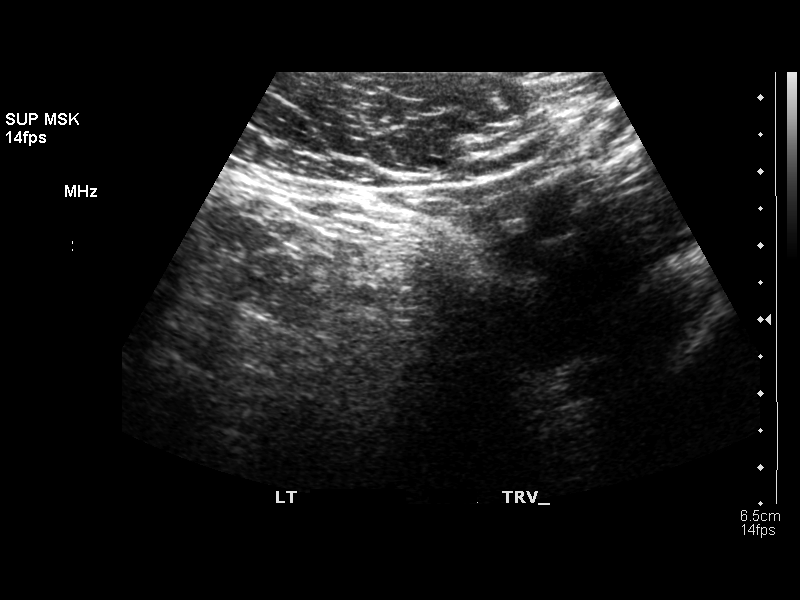
[im 7/16]
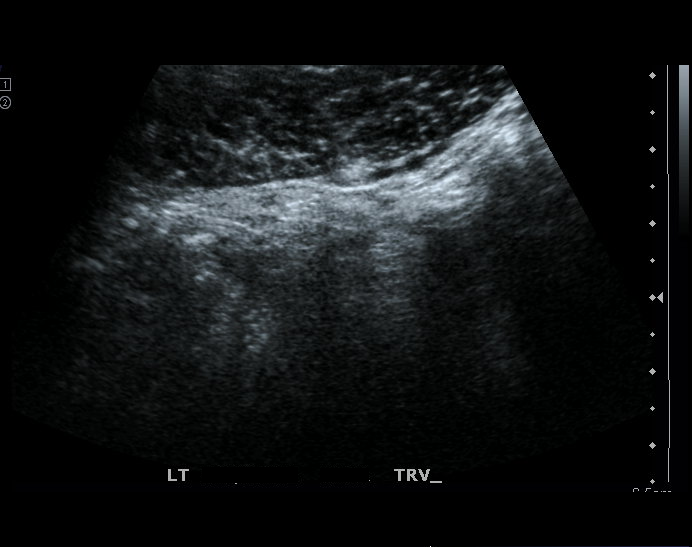
[im 8/16]
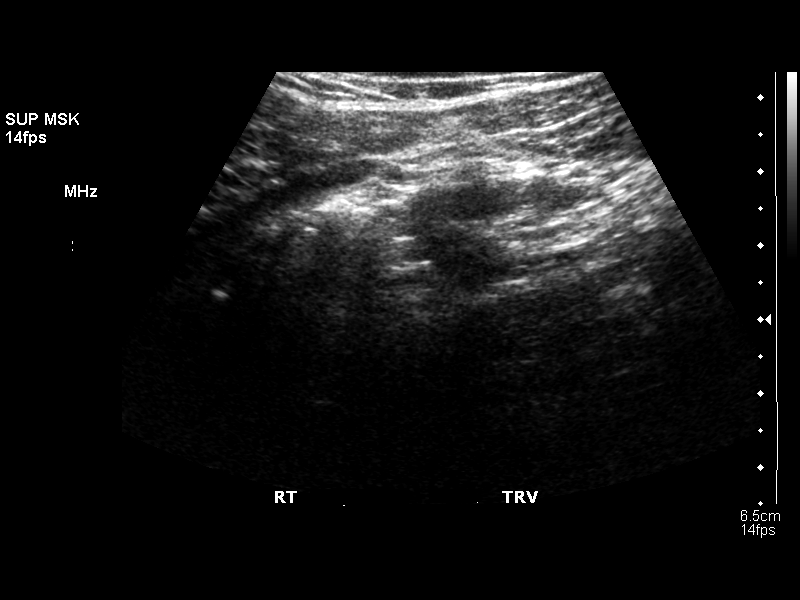
[im 9/16]
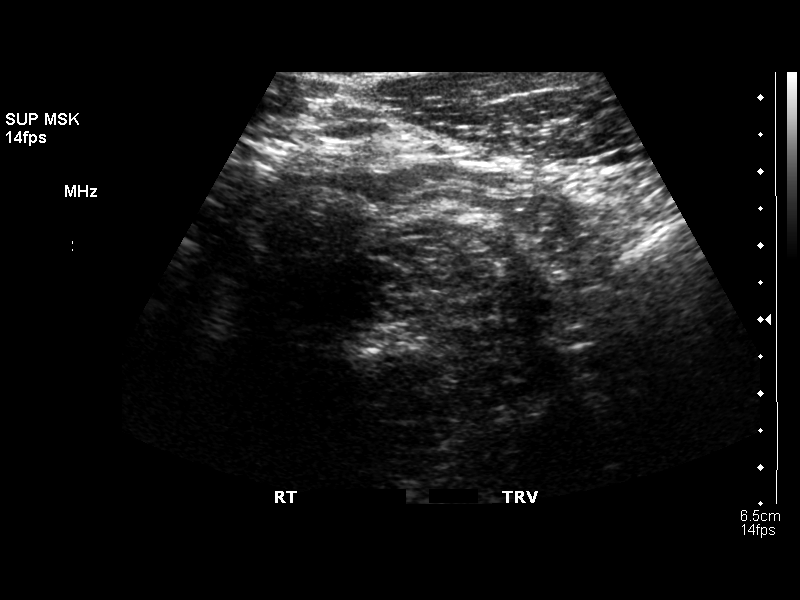
[im 11/16]
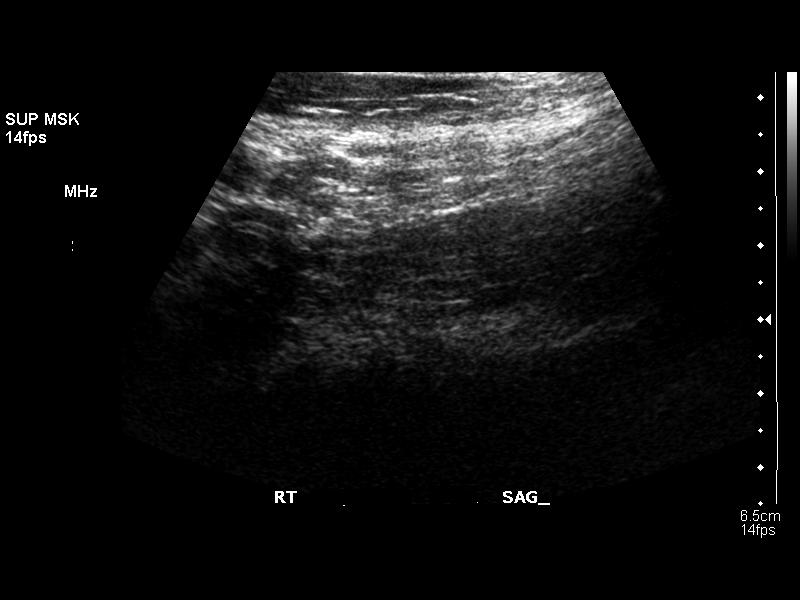
[im 12/16]
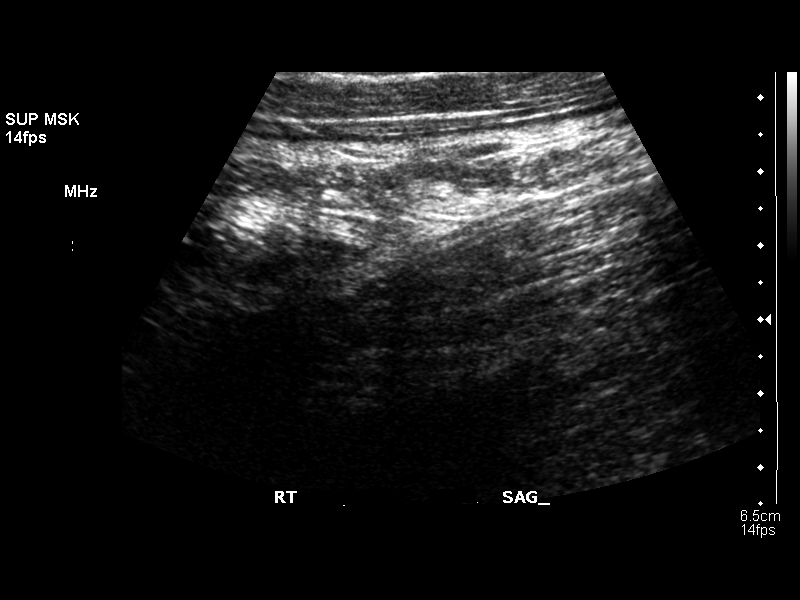
[im 13/16]
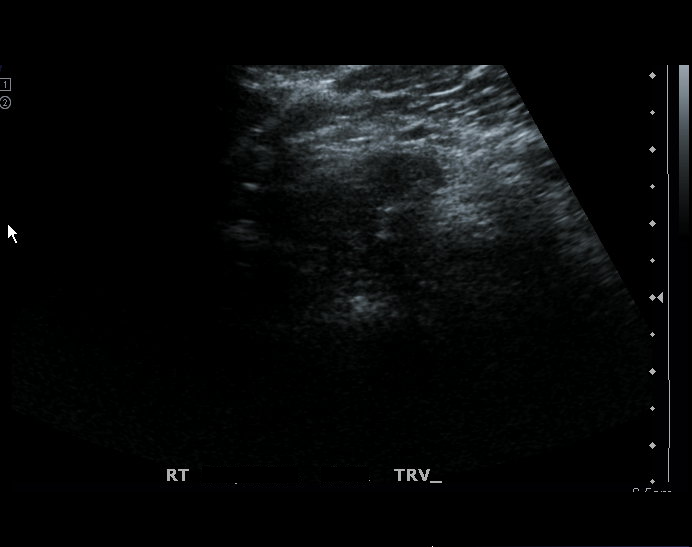
[im 15/16]
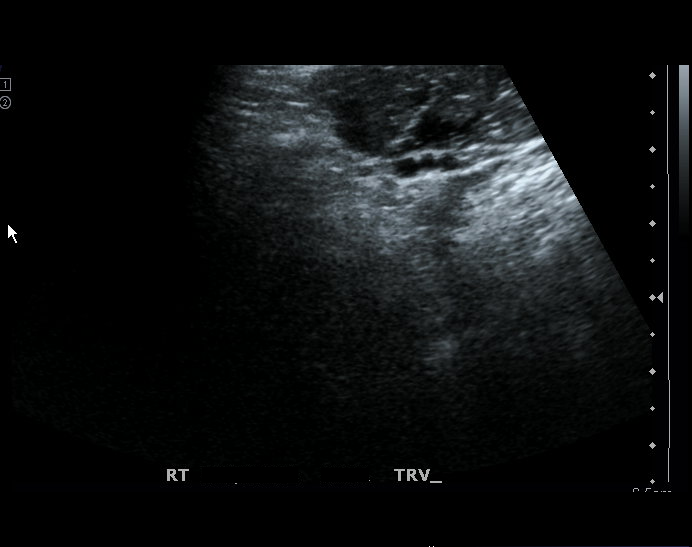
[im 16/16]
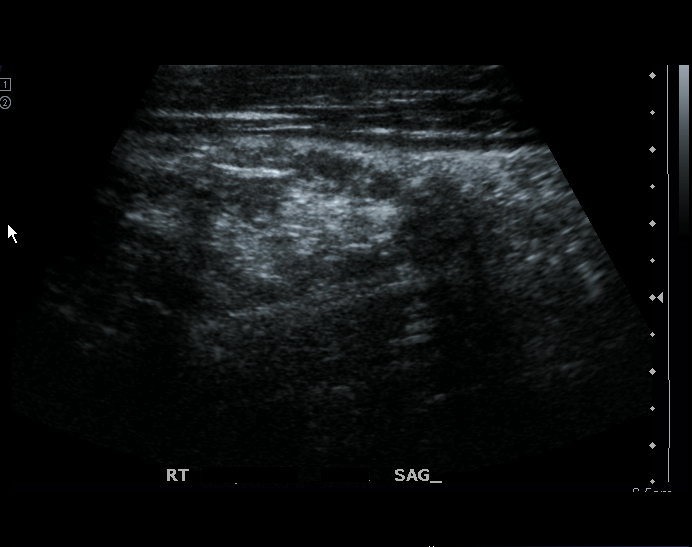

[12 of 16 positions shown; findings below may reference images not displayed]

FINDINGS: Provided grayscale and cine images of the patient's palpable area
concerns within the bilateral groins are negative for cystic or
solid mass or definitive mesenteric fat or bowel containing hernia.
IMPRESSION: No definite evidence of inguinal hernia within either the right or
left groin.

## 2016-02-04 DIAGNOSIS — J029 Acute pharyngitis, unspecified: Secondary | ICD-10-CM | POA: Diagnosis not present

## 2016-07-01 DIAGNOSIS — H52223 Regular astigmatism, bilateral: Secondary | ICD-10-CM | POA: Diagnosis not present

## 2016-10-25 DIAGNOSIS — H6693 Otitis media, unspecified, bilateral: Secondary | ICD-10-CM | POA: Diagnosis not present

## 2016-10-25 DIAGNOSIS — J029 Acute pharyngitis, unspecified: Secondary | ICD-10-CM | POA: Diagnosis not present

## 2021-10-04 ENCOUNTER — Other Ambulatory Visit: Payer: Self-pay | Admitting: Physician Assistant

## 2021-10-04 DIAGNOSIS — R748 Abnormal levels of other serum enzymes: Secondary | ICD-10-CM
